# Patient Record
Sex: Male | Born: 1990 | Race: White | Hispanic: No | Marital: Married | Smoking: Current every day smoker
Health system: Southern US, Community
[De-identification: ages and names within clinical notes are randomized; demographics above are authoritative.]

## PROBLEM LIST (undated history)

## (undated) HISTORY — PX: MIDDLE EAR SURGERY: SHX713

---
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
## 2014-05-27 ENCOUNTER — Emergency Department (HOSPITAL_COMMUNITY)
Admission: EM | Admit: 2014-05-27 | Discharge: 2014-05-27 | Disposition: A | Discharge status: Home / Self Care | Payer: BC Managed Care – PPO | Attending: Emergency Medicine | Admitting: Emergency Medicine

## 2014-05-27 ENCOUNTER — Encounter (HOSPITAL_COMMUNITY): Payer: Self-pay | Admitting: Emergency Medicine

## 2014-05-27 DIAGNOSIS — I889 Nonspecific lymphadenitis, unspecified: Secondary | ICD-10-CM

## 2014-05-27 DIAGNOSIS — K088 Other specified disorders of teeth and supporting structures: Secondary | ICD-10-CM | POA: Diagnosis present

## 2014-05-27 DIAGNOSIS — Z720 Tobacco use: Secondary | ICD-10-CM | POA: Insufficient documentation

## 2014-05-27 MED ORDER — PENICILLIN V POTASSIUM 500 MG PO TABS: 500.0000 mg | ORAL_TABLET | Freq: Four times a day (QID) | ORAL | Status: AC

## 2014-05-27 NOTE — ED Notes (Signed)
Pt with left lower dental pain, has scheduled for tooth extraction soon, states swelling to chin, pain with swallowing at times

## 2014-05-27 NOTE — Discharge Instructions (Signed)
Follow up if not improving

## 2014-05-27 NOTE — ED Provider Notes (Signed)
CSN: [id number]     Arrival date & time 05/27/14  1456 History   First MD Initiated Contact with Patient 05/27/14 1549    This chart was scribed for Benny LennertJoseph L Shiniqua Groseclose, MD by Marica OtterNusrat Rahman, ED Scribe. This patient was seen in room APA10/APA10 and the patient's care was started at 3:59 PM.  Chief Complaint  Patient presents with  . Dental Problem   Patient is a 23 y.o. male presenting with tooth pain. The history is provided by the patient. No language interpreter was used.  Dental Pain Location:  Lower Quality:  Constant Severity:  Mild Timing:  Constant Progression:  Worsening Chronicity:  Recurrent Relieved by:  Acetaminophen and NSAIDs Associated symptoms: no congestion and no headaches    PCP: No primary care provider on file. HPI Comments: Jesus Jacobson is a 23 y.o. male, with medical Hx noted below, who presents to the Emergency Department complaining of left, lower dental pain and associated swelling. Pt reports that he has an appointment with an oral surgeon to have his left, lower dentures extracted on 12/4. Pt reports he has been taking ibuprofen and prescribed med at home with relief. Pt denies any medicinal allergies.   History reviewed. No pertinent past medical history. Past Surgical History  Procedure Laterality Date  . Middle ear surgery     No family history on file. History  Substance Use Topics  . Smoking status: Current Every Day Smoker -- 1.00 packs/day    Types: Cigarettes  . Smokeless tobacco: Not on file  . Alcohol Use: Yes     Comment: occasional    Review of Systems  Constitutional: Negative for appetite change and fatigue.  HENT: Positive for dental problem (dental pain). Negative for congestion, ear discharge and sinus pressure.   Eyes: Negative for discharge.  Respiratory: Negative for cough.   Cardiovascular: Negative for chest pain.  Gastrointestinal: Negative for abdominal pain and diarrhea.  Genitourinary: Negative for frequency and  hematuria.  Musculoskeletal: Negative for back pain.  Skin: Negative for rash.  Neurological: Negative for seizures and headaches.  Psychiatric/Behavioral: Negative for hallucinations.      Allergies  Review of patient's allergies indicates no known allergies.  Home Medications   Prior to Admission medications   Not on File   Triage Vitals: BP 134/84 mmHg  Pulse 90  Temp(Src) 99.3 F (37.4 C) (Oral)  Resp 16  Ht 6' (1.829 m)  Wt 150 lb (68.04 kg)  BMI 20.34 kg/m2  SpO2 100% Physical Exam  Constitutional: He is oriented to person, place, and time. He appears well-developed.  HENT:  Head: Normocephalic.  Mouth/Throat: Uvula is midline, oropharynx is clear and moist and mucous membranes are normal.  Minor tenderness to left anterior lymph node   Eyes: Conjunctivae are normal.  Neck: No tracheal deviation present.  Cardiovascular:  No murmur heard. Musculoskeletal: Normal range of motion.  Neurological: He is oriented to person, place, and time.  Skin: Skin is warm.  Psychiatric: He has a normal mood and affect.    ED Course  Procedures (including critical care time) DIAGNOSTIC STUDIES: Oxygen Saturation is 100% on RA, nl by my interpretation.    COORDINATION OF CARE: 4:01 PM-Discussed treatment plan which includes meds with pt at bedside and pt agreed to plan.   Labs Review Labs Reviewed - No data to display  Imaging Review No results found.   EKG Interpretation None      MDM   Final diagnoses:  None    Lymphadenitis,  tx with penn  The chart was scribed for me under my direct supervision.  I personally performed the history, physical, and medical decision making and all procedures in the evaluation of this patient.Benny Lennert.    Esraa Seres L Zariya Minner, MD 05/27/14 [phone]

## 2014-05-27 NOTE — ED Notes (Signed)
Pt c/o dental pain and swelling to left lower side. States he has appointment with dentist on 12/4 but pain and swelling are worsening.

## 2020-12-03 ENCOUNTER — Emergency Department (HOSPITAL_COMMUNITY): Payer: Self-pay

## 2020-12-03 ENCOUNTER — Other Ambulatory Visit: Payer: Self-pay

## 2020-12-03 ENCOUNTER — Emergency Department (HOSPITAL_COMMUNITY)
Admission: EM | Admit: 2020-12-03 | Discharge: 2020-12-03 | Disposition: A | Discharge status: Home / Self Care | Payer: Self-pay | Attending: Emergency Medicine | Admitting: Emergency Medicine

## 2020-12-03 ENCOUNTER — Ambulatory Visit
Admission: EM | Admit: 2020-12-03 | Discharge: 2020-12-03 | Disposition: A | Discharge status: Home / Self Care | Payer: Self-pay

## 2020-12-03 ENCOUNTER — Encounter (HOSPITAL_COMMUNITY): Payer: Self-pay | Admitting: *Deleted

## 2020-12-03 DIAGNOSIS — R079 Chest pain, unspecified: Secondary | ICD-10-CM | POA: Insufficient documentation

## 2020-12-03 DIAGNOSIS — F17210 Nicotine dependence, cigarettes, uncomplicated: Secondary | ICD-10-CM | POA: Insufficient documentation

## 2020-12-03 LAB — CBC
HCT: 44.1 % (ref 39.0–52.0)
Hemoglobin: 14.9 g/dL (ref 13.0–17.0)
MCH: 31.1 pg (ref 26.0–34.0)
MCHC: 33.8 g/dL (ref 30.0–36.0)
MCV: 92.1 fL (ref 80.0–100.0)
Platelets: 267 10*3/uL (ref 150–400)
RBC: 4.79 MIL/uL (ref 4.22–5.81)
RDW: 13 % (ref 11.5–15.5)
WBC: 6.3 10*3/uL (ref 4.0–10.5)
nRBC: 0 % (ref 0.0–0.2)

## 2020-12-03 LAB — TROPONIN I (HIGH SENSITIVITY): Troponin I (High Sensitivity): <2 ng/L (ref <18)

## 2020-12-03 LAB — BASIC METABOLIC PANEL
Anion gap: 7 (ref 5–15)
BUN: 9 mg/dL (ref 6–20)
CO2: 27 mmol/L (ref 22–32)
Calcium: 9.3 mg/dL (ref 8.9–10.3)
Chloride: 102 mmol/L (ref 98–111)
Creatinine, Ser: 1.12 mg/dL (ref 0.61–1.24)
GFR, Estimated: >60 mL/min (ref >60)
Glucose, Bld: 102 mg/dL — ABNORMAL HIGH (ref 70–99)
Potassium: 4.5 mmol/L (ref 3.5–5.1)
Sodium: 136 mmol/L (ref 135–145)

## 2020-12-03 MED ORDER — ESOMEPRAZOLE MAGNESIUM 40 MG PO CPDR: 40.0000 mg | DELAYED_RELEASE_CAPSULE | Freq: Every day | ORAL | 0 refills | Status: DC

## 2020-12-03 NOTE — ED Triage Notes (Signed)
Pt c/o intermittent mid chest dullness x 1 week. Denies SOB, nausea, dizziness. Originally thought it was related to food intake, but then realized it wasn't. Pt went to Urgent Care today and was sent to ED for further evaluation. Pt reports he drinks 6 pack of beer daily in the evenings, drinks 2-3 energy drinks daily and smokes 1 pack of cigarettes daily.

## 2020-12-03 NOTE — Discharge Instructions (Signed)
I suspect that your cp is likely gastrointestinal and I have started you on a medication for acid reduction. You should avoid alcohol, caffeine, spicy foods, fatty foods, and heavy meals. Your work up showed no emergent cause of pain. Your caregiver has diagnosed you as having chest pain that is not specific for one problem, but does not require admission.  You are at low risk for an acute heart condition or other serious illness. Chest pain comes from many different causes.  SEEK IMMEDIATE MEDICAL ATTENTION IF: You have severe chest pain, especially if the pain is crushing or pressure-like and spreads to the arms, back, neck, or jaw, or if you have sweating, nausea (feeling sick to your stomach), or shortness of breath. THIS IS AN EMERGENCY. Don't wait to see if the pain will go away. Get medical help at once. Call 911 or 0 (operator). DO NOT drive yourself to the hospital.  Your chest pain gets worse and does not go away with rest.  You have an attack of chest pain lasting longer than usual, despite rest and treatment with the medications your caregiver has prescribed.  You wake from sleep with chest pain or shortness of breath.  You feel dizzy or faint.  You have chest pain not typical of your usual pain for which you originally saw your caregiver.

## 2020-12-03 NOTE — ED Triage Notes (Signed)
For the past week pt has been having intermittent chest pain to center of chest. No pain currently.

## 2020-12-03 NOTE — ED Notes (Signed)
Patient is being discharged from the Urgent Care and sent to the Emergency Department via pov . Per Moshe Cipro, NP , patient is in need of higher level of care due to chest pain. Patient is aware and verbalizes understanding of plan of care.  Vitals:   12/03/20 0852  BP: 122/80  Pulse: 67  Resp: 18  Temp: 97.9 F (36.6 C)  SpO2: 97%

## 2020-12-03 NOTE — ED Provider Notes (Signed)
[facility] EMERGENCY DEPARTMENT Provider Note   CSN: [id number] Arrival date & time: 12/03/20  0865     History Chief Complaint  Patient presents with  . Chest Pain    Jesus Jacobson is a 30 y.o. male who presents emergency department with a chief complaint of chest pain.  Patient describes intermittent pain near his sternum lasting for a minute or 2 at a time.  He rates the pain at 7 out of 10, dull, aching, nonradiating.  He denies shortness of breath, nausea, vomiting, diaphoresis.  There are no exertional components.  At first the pain began with eating and now occurs without but does not wake him out of his sleep.  He has tried Tums and Tylenol which seem to have helped.  The patient does drink beer daily, 2-3 energy drinks daily, smokes a pack of cigarettes a day but denies a history of hypertension, hyperlipidemia, diabetes, family history of early MI, unilateral leg swelling, recent surgery or confinement, history of DVT or PE.  HPI  HPI: A 30 year old patient presents for evaluation of chest pain. Initial onset of pain was more than 6 hours ago. The patient's chest pain is not worse with exertion. The patient's chest pain is middle- or left-sided, is not well-localized, is not described as heaviness/pressure/tightness, is not sharp and does not radiate to the arms/jaw/neck. The patient does not complain of nausea and denies diaphoresis. The patient has smoked in the past 90 days. The patient has no history of stroke, has no history of peripheral artery disease, denies any history of treated diabetes, has no relevant family history of coronary artery disease (first degree relative at less than age 12), is not hypertensive, has no history of hypercholesterolemia and does not have an elevated BMI (>=30).   History reviewed. No pertinent past medical history.  There are no problems to display for this patient.   Past Surgical History:  Procedure Laterality Date  . MIDDLE EAR SURGERY          No family history on file.  Social History   Tobacco Use  . Smoking status: Current Every Day Smoker    Packs/day: 1.00    Types: Cigarettes  . Smokeless tobacco: Never Used  Vaping Use  . Vaping Use: Never used  Substance Use Topics  . Alcohol use: Yes    Alcohol/week: 42.0 standard drinks    Types: 42 Cans of beer per week    Comment: 6 pack of beer daily in the evenings  . Drug use: No    Home Medications Prior to Admission medications   Medication Sig Start Date End Date Taking? Authorizing Provider  esomeprazole (NEXIUM) 40 MG capsule Take 1 capsule (40 mg total) by mouth daily. 12/03/20  Yes Arthor Captain, PA-C    Allergies    Patient has no known allergies.  Review of Systems   Review of Systems Ten systems reviewed and are negative for acute change, except as noted in the HPI.   Physical Exam Updated Vital Signs BP 119/71 (BP Location: Left Arm)   Pulse 76   Temp 98.3 F (36.8 C) (Oral)   Resp 18   Ht 6' (1.829 m)   Wt 74.8 kg   SpO2 98%   BMI 22.38 kg/m   Physical Exam Physical Exam  Nursing note and vitals reviewed. Constitutional: He appears well-developed and well-nourished. No distress.  HENT:  Head: Normocephalic and atraumatic.  Eyes: Conjunctivae normal are normal. No scleral icterus.  Neck: Normal range  of motion. Neck supple.  Cardiovascular: Normal rate, regular rhythm and normal heart sounds.   Pulmonary/Chest: Effort normal and breath sounds normal. No respiratory distress.  Abdominal: Soft. There is no tenderness.  Musculoskeletal: He exhibits no edema.  Neurological: He is alert.  Skin: Skin is warm and dry. He is not diaphoretic.  Psychiatric: His behavior is normal.    ED Results / Procedures / Treatments   Labs (all labs ordered are listed, but only abnormal results are displayed) Labs Reviewed  BASIC METABOLIC PANEL - Abnormal; Notable for the following components:      Result Value   Glucose, Bld 102 (*)     All other components within normal limits  CBC  TROPONIN I (HIGH SENSITIVITY)  TROPONIN I (HIGH SENSITIVITY)    EKG EKG Interpretation  Date/Time:  Tuesday Dec 03 2020 09:33:29 EDT Ventricular Rate:  66 PR Interval:  186 QRS Duration: 107 QT Interval:  397 QTC Calculation: 416 R Axis:   70 Text Interpretation: Sinus rhythm RSR' in V1 or V2, probably normal variant ST elev, probable normal early repol pattern No previous ECGs available Confirmed by Vanetta Mulders [phone]) on 12/03/2020 10:01:00 AM   Radiology DG Chest Port 1 View  Result Date: 12/03/2020 CLINICAL DATA:  Chest pain. EXAM: PORTABLE CHEST 1 VIEW COMPARISON:  None. FINDINGS: The heart size and mediastinal contours are within normal limits. Both lungs are clear. The visualized skeletal structures are unremarkable. IMPRESSION: No active disease. Electronically Signed   By: Lupita Raider M.D.   On: 12/03/2020 09:55    Procedures Procedures   Medications Ordered in ED Medications - No data to display  ED Course  I have reviewed the triage vital signs and the nursing notes.  Pertinent labs & imaging results that were available during my care of the patient were reviewed by me and considered in my medical decision making (see chart for details).    MDM Rules/Calculators/A&P HEAR Score: 2                         Patient here with complaint of chest pain.  Low risk heart score PERC negative.  Suspect GI cause.  I ordered and reviewed BMP CBC both without significant abnormality, troponin within normal limits.  I ordered and reviewed images of a portable 1 view chest x-ray which shows no abnormalities or wide mediastinum.  Patient's EKG shows some early repolarization.  But otherwise normal sinus rhythm at a rate of 66.  Given the large differential diagnosis for Jesus Jacobson, the decision making in this case is of high complexity.  After evaluating all of the data points in this case, the presentation of Jesus Jacobson is NOT consistent with Acute Coronary Syndrome (ACS) and/or myocardial ischemia, pulmonary embolism, aortic dissection; Borhaave's, significant arrythmia, pneumothorax, cardiac tamponade, or other emergent cardiopulmonary condition.  Further, the presentation of Jesus Jacobson is NOT consistent with pericarditis, myocarditis, cholecystitis, pancreatitis, mediastinitis, endocarditis, new valvular disease.  Additionally, the presentation of Jesus Jacobson NOT consistent with flail chest, cardiac contusion, ARDS, or significant intra-thoracic or intra-abdominal bleeding.  Moreover, this presentation is NOT consistent with pneumonia, sepsis, or pyelonephritis.      Strict return and follow-up precautions have been given by me personally or by detailed written instruction given verbally by nursing staff using the teach back method to the patient/family/caregiver(s).  Data Reviewed/Counseling: I have reviewed the patient's vital signs, nursing notes, and other relevant tests/information. I had a  detailed discussion regarding the historical points, exam findings, and any diagnostic results supporting the discharge diagnosis. I also discussed the need for outpatient follow-up and the need to return to the ED if symptoms worsen or if there are any questions or concerns that arise at home.  Final Clinical Impression(s) / ED Diagnoses Final diagnoses:  Chest pain with low risk for cardiac etiology    Rx / DC Orders ED Discharge Orders         Ordered    esomeprazole (NEXIUM) 40 MG capsule  Daily        12/03/20 1215           Arthor Captain, PA-C 12/03/20 1237    Vanetta Mulders, MD 12/05/20 [phone]

## 2020-12-11 ENCOUNTER — Ambulatory Visit: Payer: Self-pay | Admitting: Internal Medicine

## 2020-12-22 ENCOUNTER — Ambulatory Visit
Admission: EM | Admit: 2020-12-22 | Discharge: 2020-12-22 | Disposition: A | Discharge status: Home / Self Care | Payer: Self-pay | Attending: Family Medicine | Admitting: Family Medicine

## 2020-12-22 ENCOUNTER — Other Ambulatory Visit: Payer: Self-pay

## 2020-12-22 ENCOUNTER — Encounter: Payer: Self-pay | Admitting: Emergency Medicine

## 2020-12-22 DIAGNOSIS — R21 Rash and other nonspecific skin eruption: Secondary | ICD-10-CM

## 2020-12-22 MED ORDER — TRIAMCINOLONE ACETONIDE 0.025 % EX OINT: 1.0000 "application " | TOPICAL_OINTMENT | Freq: Two times a day (BID) | CUTANEOUS | 0 refills | Status: AC

## 2020-12-22 MED ORDER — PREDNISONE 20 MG PO TABS: 40.0000 mg | ORAL_TABLET | Freq: Every day | ORAL | 0 refills | Status: DC

## 2020-12-22 MED ORDER — FAMOTIDINE 40 MG PO TABS: 40.0000 mg | ORAL_TABLET | Freq: Two times a day (BID) | ORAL | 0 refills | Status: DC

## 2020-12-22 NOTE — ED Triage Notes (Signed)
Rash all over body that comes and goes x 2 weeks that keeps getting worse. Rash looks worse today. Unsure of cause.

## 2020-12-22 NOTE — ED Provider Notes (Signed)
[facility]    CSN: [id number] Arrival date & time: 12/22/20  1227      History   Chief Complaint Chief Complaint  Patient presents with   Rash    HPI Jesus Jacobson is a 30 y.o. male.   HPI Generalized macula like rash eruption generalized to entire body.  He denies any recent viral infection.  He reports that the rash is occasionally pruritic.  The rash comes and goes throughout the day.  He reports the only changes prior to the onset of the rash is he started on Nexium for acid reflux.  He initially thought the rash was related to Nexium and discontinued to Nexium however resumed back taking it after the rash reappeared and his reflux symptoms worsen.  He denies any history of any type of chronic recurrent skin rashes.  History reviewed. No pertinent past medical history.  There are no problems to display for this patient.   Past Surgical History:  Procedure Laterality Date   MIDDLE EAR SURGERY         Home Medications    Prior to Admission medications   Medication Sig Start Date End Date Taking? Authorizing Provider  esomeprazole (NEXIUM) 40 MG capsule Take 1 capsule (40 mg total) by mouth daily. 12/03/20   Arthor Captain, PA-C    Family History No family history on file.  Social History Social History   Tobacco Use   Smoking status: Every Day    Packs/day: 1.00    Pack years: 0.00    Types: Cigarettes   Smokeless tobacco: Never  Vaping Use   Vaping Use: Never used  Substance Use Topics   Alcohol use: Yes    Alcohol/week: 42.0 standard drinks    Types: 42 Cans of beer per week    Comment: 6 pack of beer daily in the evenings   Drug use: No     Allergies   Patient has no known allergies.   Review of Systems Review of Systems Pertinent negatives listed in HPI   Physical Exam Triage Vital Signs ED Triage Vitals [12/22/20 1408]  Enc Vitals Group     BP (!) 131/92     Pulse Rate 88     Resp 19     Temp 99.5 F (37.5 C)      Temp Source Oral     SpO2 98 %     Weight      Height      Head Circumference      Peak Flow      Pain Score 0     Pain Loc      Pain Edu?      Excl. in GC?    No data found.  Updated Vital Signs BP (!) 131/92 (BP Location: Right Arm)   Pulse 88   Temp 99.5 F (37.5 C) (Oral)   Resp 19   SpO2 98%   Visual Acuity Right Eye Distance:   Left Eye Distance:   Bilateral Distance:    Right Eye Near:   Left Eye Near:    Bilateral Near:     Physical Exam Constitutional:      Appearance: Normal appearance.  Cardiovascular:     Rate and Rhythm: Normal rate and regular rhythm.  Pulmonary:     Effort: Pulmonary effort is normal.     Breath sounds: Normal breath sounds.  Skin:    General: Skin is warm.     Comments: Generalized body rash macular with oval-shaped  patterns upper and lower extremities entire torso neck.  Face is unaffected  Neurological:     Mental Status: He is alert.  Psychiatric:        Attention and Perception: Attention normal.        Speech: Speech normal.        Cognition and Memory: Cognition normal.     UC Treatments / Results  Labs (all labs ordered are listed, but only abnormal results are displayed) Labs Reviewed - No data to display  EKG   Radiology No results found.  Procedures Procedures (including critical care time)  Medications Ordered in UC Medications - No data to display  Initial Impression / Assessment and Plan / UC Course  I have reviewed the triage vital signs and the nursing notes.  Pertinent labs & imaging results that were available during my care of the patient were reviewed by me and considered in my medical decision making (see chart for details).    Rash of unknown etiology affecting the entire body.  Rash looks suspicious to previous reactions related to medications versus pityriasis rosea. Will trial prednisone, dc Nexium start famotidine and triamcinolone application as needed.  Advised if symptoms do not  readily resolve with prescribed medication would recommend evaluation by dermatology. Final Clinical Impressions(s) / UC Diagnoses   Final diagnoses:  Rash of entire body     Discharge Instructions      Discontinue Nexium as I suspect this rash may have been erupted due to an allergic reaction..  Starting on famotidine 40 mg twice daily as needed for acid reflux symptoms.  Start prednisone 40 mg once daily for the next 5 days take with food to prevent insomnia take in the morning time. For topical irritation and rash apply triamcinolone cream twice daily to the entire affected area.  You may also mix with petroleum jelly to help provide additional moisture to your skin. If rash persist and does not resolve with prescribed treatment follow-up with dermatology for further work-up and evaluation.     ED Prescriptions     Medication Sig Dispense Auth. Provider   famotidine (PEPCID) 40 MG tablet Take 1 tablet (40 mg total) by mouth 2 (two) times daily. 180 tablet Bing Neighbors, FNP   predniSONE (DELTASONE) 20 MG tablet Take 2 tablets (40 mg total) by mouth daily with breakfast. 10 tablet Bing Neighbors, FNP   triamcinolone (KENALOG) 0.025 % ointment Apply 1 application topically 2 (two) times daily. 454 g Bing Neighbors, FNP      PDMP not reviewed this encounter.   Bing Neighbors, FNP 12/23/20 [phone]

## 2020-12-22 NOTE — Discharge Instructions (Signed)
Discontinue Nexium as I suspect this rash may have been erupted due to an allergic reaction..  Starting on famotidine 40 mg twice daily as needed for acid reflux symptoms.  Start prednisone 40 mg once daily for the next 5 days take with food to prevent insomnia take in the morning time. For topical irritation and rash apply triamcinolone cream twice daily to the entire affected area.  You may also mix with petroleum jelly to help provide additional moisture to your skin. If rash persist and does not resolve with prescribed treatment follow-up with dermatology for further work-up and evaluation.

## 2020-12-27 ENCOUNTER — Other Ambulatory Visit: Payer: Self-pay

## 2020-12-27 ENCOUNTER — Ambulatory Visit (INDEPENDENT_AMBULATORY_CARE_PROVIDER_SITE_OTHER): Payer: Self-pay | Admitting: Internal Medicine

## 2020-12-27 ENCOUNTER — Encounter: Payer: Self-pay | Admitting: Internal Medicine

## 2020-12-27 VITALS — BP 134/75 | HR 82 | Temp 98.3°F | Resp 20 | Ht 73.0 in | Wt 174.0 lb

## 2020-12-27 DIAGNOSIS — T7840XA Allergy, unspecified, initial encounter: Secondary | ICD-10-CM | POA: Insufficient documentation

## 2020-12-27 DIAGNOSIS — K219 Gastro-esophageal reflux disease without esophagitis: Secondary | ICD-10-CM

## 2020-12-27 DIAGNOSIS — Z7689 Persons encountering health services in other specified circumstances: Secondary | ICD-10-CM | POA: Insufficient documentation

## 2020-12-27 DIAGNOSIS — Z720 Tobacco use: Secondary | ICD-10-CM

## 2020-12-27 DIAGNOSIS — Z1159 Encounter for screening for other viral diseases: Secondary | ICD-10-CM

## 2020-12-27 DIAGNOSIS — T7840XD Allergy, unspecified, subsequent encounter: Secondary | ICD-10-CM

## 2020-12-27 NOTE — Assessment & Plan Note (Signed)
Smokes about 0.5 pack/day  Asked about quitting: confirms that he currently smokes cigarettes Advise to quit smoking: Educated about QUITTING to reduce the risk of cancer, cardio and cerebrovascular disease. Assess willingness: Unwilling to quit at this time, but is working on cutting back. Assist with counseling and pharmacotherapy: Counseled for 5 minutes and literature provided. Arrange for follow up: follow up and continue to offer help.

## 2020-12-27 NOTE — Patient Instructions (Signed)
Please start taking Pepcid for acid reflux.

## 2020-12-27 NOTE — Progress Notes (Signed)
New Patient Office Visit  Subjective:  Patient ID: Jesus Jacobson, male    DOB: 12-15-1990  Age: 30 y.o. MRN: [id number]  CC:  Chief Complaint  Patient presents with   New Patient (Initial Visit)    HPI Jesus Jacobson is a 30 year old male with PMH of GERD who presents for establishing care.  He recently had ER visit for c/o diffuse maculopapular rash over b/l UE, chest and abdominal wall and LE. He started noticing rash in neck area and went to ER at that point. He was given steroids and was told to hold Nexium which he had started taking recently. States that his rash has been improving for last 5 days since he had his last Nexium. He has been taking Benadryl PRN.  He smokes about 0.5 pack/day.  Patient has not had COVID vaccine yet.  History reviewed. No pertinent past medical history.  Past Surgical History:  Procedure Laterality Date   MIDDLE EAR SURGERY      History reviewed. No pertinent family history.  Social History   Socioeconomic History   Marital status: Single    Spouse name: Not on file   Number of children: Not on file   Years of education: Not on file   Highest education level: Not on file  Occupational History   Not on file  Tobacco Use   Smoking status: Every Day    Packs/day: 1.00    Pack years: 0.00    Types: Cigarettes   Smokeless tobacco: Never  Vaping Use   Vaping Use: Never used  Substance and Sexual Activity   Alcohol use: Yes    Alcohol/week: 42.0 standard drinks    Types: 42 Cans of beer per week    Comment: 1-2 daily   Drug use: No   Sexual activity: Yes  Other Topics Concern   Not on file  Social History Narrative   Not on file   Social Determinants of Health   Financial Resource Strain: Not on file  Food Insecurity: Not on file  Transportation Needs: Not on file  Physical Activity: Not on file  Stress: Not on file  Social Connections: Not on file  Intimate Partner Violence: Not on file    ROS Review of Systems   Constitutional:  Negative for chills and fever.  HENT:  Negative for congestion and sore throat.   Eyes:  Negative for pain and discharge.  Respiratory:  Negative for cough and shortness of breath.   Cardiovascular:  Negative for chest pain and palpitations.  Gastrointestinal:  Negative for constipation, diarrhea, nausea and vomiting.  Endocrine: Negative for polydipsia and polyuria.  Genitourinary:  Negative for dysuria and hematuria.  Musculoskeletal:  Negative for neck pain and neck stiffness.  Skin:  Positive for rash.  Neurological:  Negative for dizziness, weakness, numbness and headaches.  Psychiatric/Behavioral:  Negative for agitation and behavioral problems.    Objective:   Today's Vitals: BP 134/75 (BP Location: Right Arm, Patient Position: Sitting, Cuff Size: Large)   Pulse 82   Temp 98.3 F (36.8 C)   Resp 20   Ht 6' 1"  (1.854 m)   Wt 174 lb (78.9 kg)   SpO2 97%   BMI 22.96 kg/m   Physical Exam Vitals reviewed.  Constitutional:      General: He is not in acute distress.    Appearance: He is not diaphoretic.  HENT:     Head: Normocephalic and atraumatic.     Nose: Nose normal.  Mouth/Throat:     Mouth: Mucous membranes are moist.  Eyes:     General: No scleral icterus.    Extraocular Movements: Extraocular movements intact.  Cardiovascular:     Rate and Rhythm: Normal rate and regular rhythm.     Pulses: Normal pulses.     Heart sounds: Normal heart sounds. No murmur heard. Pulmonary:     Breath sounds: Normal breath sounds. No wheezing or rales.  Abdominal:     Palpations: Abdomen is soft.     Tenderness: There is no abdominal tenderness.  Musculoskeletal:     Cervical back: Neck supple. No tenderness.     Right lower leg: No edema.     Left lower leg: No edema.  Skin:    General: Skin is warm.     Findings: Rash (maculopapular rash over b/l UE and chest wall) present.  Neurological:     General: No focal deficit present.     Mental Status:  He is alert and oriented to person, place, and time.     Sensory: No sensory deficit.     Motor: No weakness.  Psychiatric:        Mood and Affect: Mood normal.        Behavior: Behavior normal.    Assessment & Plan:   Problem List Items Addressed This Visit       Digestive   Gastroesophageal reflux disease without esophagitis    Had allergic reaction to Nexium (likely) Started on Pepcid PRN Advised to avoid hot and spicy food         Other   Encounter to establish care - Primary    Care established Previous chart reviewed History and medications reviewed with the patient       Relevant Orders   CBC with Differential/Platelet   CMP14+EGFR   Lipid panel   TSH   Tobacco abuse    Smokes about 0.5 pack/day  Asked about quitting: confirms that he currently smokes cigarettes Advise to quit smoking: Educated about QUITTING to reduce the risk of cancer, cardio and cerebrovascular disease. Assess willingness: Unwilling to quit at this time, but is working on cutting back. Assist with counseling and pharmacotherapy: Counseled for 5 minutes and literature provided. Arrange for follow up: follow up and continue to offer help.       Allergic reaction    Likely to Nexium Rash similar to Pityriasis Better since stopping Nexium; and with Prednisone and Benadryl Also does yard work, which could be a contributing factor, advised to contact if persistent rash Triamcinolone cream for itching Benadryl or Zyrtec PRN       Other Visit Diagnoses     Need for hepatitis C screening test       Relevant Orders   Hepatitis C Antibody       Outpatient Encounter Medications as of 12/27/2020  Medication Sig   famotidine (PEPCID) 40 MG tablet Take 1 tablet (40 mg total) by mouth 2 (two) times daily.   triamcinolone (KENALOG) 0.025 % ointment Apply 1 application topically 2 (two) times daily.   [DISCONTINUED] predniSONE (DELTASONE) 20 MG tablet Take 2 tablets (40 mg total) by mouth  daily with breakfast.   No facility-administered encounter medications on file as of 12/27/2020.    Follow-up: Return in about 1 year (around 12/27/2021) for Annual physical.   Lindell Spar, MD

## 2020-12-27 NOTE — Assessment & Plan Note (Signed)
Care established Previous chart reviewed History and medications reviewed with the patient 

## 2020-12-27 NOTE — Assessment & Plan Note (Addendum)
Had allergic reaction to Nexium (likely) Started on Pepcid PRN Advised to avoid hot and spicy food

## 2020-12-27 NOTE — Assessment & Plan Note (Addendum)
Likely to Nexium Rash similar to Pityriasis Better since stopping Nexium; and with Prednisone and Benadryl Also does yard work, which could be a contributing factor, advised to contact if persistent rash Triamcinolone cream for itching Benadryl or Zyrtec PRN

## 2021-02-24 ENCOUNTER — Emergency Department (HOSPITAL_COMMUNITY)
Admission: EM | Admit: 2021-02-24 | Discharge: 2021-02-24 | Disposition: A | Discharge status: Home / Self Care | Payer: Self-pay | Attending: Student | Admitting: Student

## 2021-02-24 ENCOUNTER — Other Ambulatory Visit: Payer: Self-pay

## 2021-02-24 ENCOUNTER — Encounter (HOSPITAL_COMMUNITY): Payer: Self-pay | Admitting: *Deleted

## 2021-02-24 DIAGNOSIS — F17210 Nicotine dependence, cigarettes, uncomplicated: Secondary | ICD-10-CM | POA: Insufficient documentation

## 2021-02-24 DIAGNOSIS — R21 Rash and other nonspecific skin eruption: Secondary | ICD-10-CM | POA: Insufficient documentation

## 2021-02-24 LAB — CBC WITH DIFFERENTIAL/PLATELET
Abs Immature Granulocytes: 0.06 10*3/uL (ref 0.00–0.07)
Basophils Absolute: 0.1 10*3/uL (ref 0.0–0.1)
Basophils Relative: 0 %
Eosinophils Absolute: 0.1 10*3/uL (ref 0.0–0.5)
Eosinophils Relative: 1 %
HCT: 45.4 % (ref 39.0–52.0)
Hemoglobin: 15.5 g/dL (ref 13.0–17.0)
Immature Granulocytes: 0 %
Lymphocytes Relative: 8 %
Lymphs Abs: 1.2 10*3/uL (ref 0.7–4.0)
MCH: 31.1 pg (ref 26.0–34.0)
MCHC: 34.1 g/dL (ref 30.0–36.0)
MCV: 91.2 fL (ref 80.0–100.0)
Monocytes Absolute: 1.2 10*3/uL — ABNORMAL HIGH (ref 0.1–1.0)
Monocytes Relative: 8 %
Neutro Abs: 11.9 10*3/uL — ABNORMAL HIGH (ref 1.7–7.7)
Neutrophils Relative %: 83 %
Platelets: 297 10*3/uL (ref 150–400)
RBC: 4.98 MIL/uL (ref 4.22–5.81)
RDW: 13.1 % (ref 11.5–15.5)
WBC: 14.5 10*3/uL — ABNORMAL HIGH (ref 4.0–10.5)
nRBC: 0 % (ref 0.0–0.2)

## 2021-02-24 LAB — URINALYSIS, ROUTINE W REFLEX MICROSCOPIC
Bacteria, UA: NONE SEEN
Bilirubin Urine: NEGATIVE
Glucose, UA: NEGATIVE mg/dL
Ketones, ur: 5 mg/dL — AB
Leukocytes,Ua: NEGATIVE
Nitrite: NEGATIVE
Protein, ur: NEGATIVE mg/dL
Specific Gravity, Urine: 1.004 — ABNORMAL LOW (ref 1.005–1.030)
pH: 6 (ref 5.0–8.0)

## 2021-02-24 LAB — COMPREHENSIVE METABOLIC PANEL
ALT: 26 U/L (ref 0–44)
AST: 25 U/L (ref 15–41)
Albumin: 4.7 g/dL (ref 3.5–5.0)
Alkaline Phosphatase: 75 U/L (ref 38–126)
Anion gap: 10 (ref 5–15)
BUN: 13 mg/dL (ref 6–20)
CO2: 22 mmol/L (ref 22–32)
Calcium: 9.4 mg/dL (ref 8.9–10.3)
Chloride: 105 mmol/L (ref 98–111)
Creatinine, Ser: 1.02 mg/dL (ref 0.61–1.24)
GFR, Estimated: >60 mL/min (ref >60)
Glucose, Bld: 98 mg/dL (ref 70–99)
Potassium: 4 mmol/L (ref 3.5–5.1)
Sodium: 137 mmol/L (ref 135–145)
Total Bilirubin: 0.6 mg/dL (ref 0.3–1.2)
Total Protein: 7.4 g/dL (ref 6.5–8.1)

## 2021-02-24 LAB — HIV ANTIBODY (ROUTINE TESTING W REFLEX): HIV Screen 4th Generation wRfx: NONREACTIVE

## 2021-02-24 LAB — C-REACTIVE PROTEIN: CRP: 0.8 mg/dL (ref <1.0)

## 2021-02-24 LAB — HEPATITIS PANEL, ACUTE
HCV Ab: NONREACTIVE
Hep A IgM: NONREACTIVE
Hep B C IgM: NONREACTIVE
Hepatitis B Surface Ag: NONREACTIVE

## 2021-02-24 LAB — SEDIMENTATION RATE: Sed Rate: 2 mm/hr (ref 0–16)

## 2021-02-24 MED ORDER — FAMOTIDINE IN NACL 20-0.9 MG/50ML-% IV SOLN
20.0000 mg | Freq: Once | INTRAVENOUS | Status: AC
  Administered 2021-02-24: 20 mg via INTRAVENOUS
  Filled 2021-02-24: qty 50

## 2021-02-24 MED ORDER — PREDNISONE 10 MG PO TABS: 30.0000 mg | ORAL_TABLET | Freq: Every day | ORAL | 0 refills | Status: AC

## 2021-02-24 MED ORDER — METHYLPREDNISOLONE SODIUM SUCC 125 MG IJ SOLR
125.0000 mg | Freq: Once | INTRAMUSCULAR | Status: AC
  Administered 2021-02-24: 125 mg via INTRAVENOUS
  Filled 2021-02-24: qty 2

## 2021-02-24 NOTE — ED Triage Notes (Signed)
Rash over entire body with itching, states it started after breakfast this am. States he has had episodes of hives for over a month

## 2021-02-24 NOTE — Discharge Instructions (Addendum)
Suspect the rash is a allergic reaction to something within your environment.  I have started you on steroids please take as prescribed.  Please continue taking Zyrtec as well as Pepcid as this will help with the rash.  Please follow-up with the allergist as well as dermatology for further evaluation.  Come back to the emergency department if you develop chest pain, shortness of breath, severe abdominal pain, uncontrolled nausea, vomiting, diarrhea.

## 2021-02-24 NOTE — ED Provider Notes (Signed)
[facility] EMERGENCY DEPARTMENT Provider Note   CSN: [id number] Arrival date & time: 02/24/21  1210     History Chief Complaint  Patient presents with   Allergic Reaction    Jesus Jacobson is a 30 y.o. male.  HPI  Patient with no significant medical history presents to the emergency department with chief complaint of a rash.  Patient states the rash started this morning, states that he ate his breakfast which consists of bacon, eggs, grits, this is stuff that he is ate in the past, he then broke out in a full body rash, states that initially was on his forearms and his chest but now spread to his lower extremities back and face.  He states the rash is not itchy, does not sting, no associated shortness of breath, tongue, throat, lip swelling, no GI symptoms.  He notes that he took Excedrin earlier today because he had a headache, states he has had this in the past, he took this morning because he was hung over from last night.  He notes that he has had a rash like this once in the past, was when he was started on Nexium, he immediately broke out in a rash similar to this, he has discontinued Nexium as not taking this since.  He change in detergents, new animals, buying new clothes, denies recent tick bites, denies penile discharge or testicular pain, states he is monogamous relationship.  He states that he took some Benadryl today which had seemed to help slightly.  He has no other complaints at this time.  He does not endorse fevers, chills, chest pain, abdominal pain, nausea, vomiting, diarrhea.  History reviewed. No pertinent past medical history.  Patient Active Problem List   Diagnosis Date Noted   Encounter to establish care 12/27/2020   Gastroesophageal reflux disease without esophagitis 12/27/2020   Tobacco abuse 12/27/2020   Allergic reaction 12/27/2020    Past Surgical History:  Procedure Laterality Date   MIDDLE EAR SURGERY         No family history on file.  Social  History   Tobacco Use   Smoking status: Every Day    Packs/day: 1.00    Types: Cigarettes   Smokeless tobacco: Never  Vaping Use   Vaping Use: Never used  Substance Use Topics   Alcohol use: Yes    Alcohol/week: 42.0 standard drinks    Types: 42 Cans of beer per week    Comment: 1-2 daily   Drug use: No    Home Medications Prior to Admission medications   Medication Sig Start Date End Date Taking? Authorizing Provider  predniSONE (DELTASONE) 10 MG tablet Take 3 tablets (30 mg total) by mouth daily for 5 days. 02/24/21 03/01/21 Yes Carroll Sage, PA-C  famotidine (PEPCID) 40 MG tablet Take 1 tablet (40 mg total) by mouth 2 (two) times daily. 12/22/20   Bing Neighbors, FNP  triamcinolone (KENALOG) 0.025 % ointment Apply 1 application topically 2 (two) times daily. 12/22/20   Bing Neighbors, FNP    Allergies    Nexium [esomeprazole]  Review of Systems   Review of Systems  Constitutional:  Negative for chills and fever.  HENT:  Negative for congestion.   Respiratory:  Negative for shortness of breath.   Cardiovascular:  Negative for chest pain.  Gastrointestinal:  Negative for abdominal pain.  Genitourinary:  Negative for enuresis.  Musculoskeletal:  Negative for back pain.  Skin:  Positive for rash.  Neurological:  Negative for dizziness.  Hematological:  Does not bruise/bleed easily.   Physical Exam Updated Vital Signs BP 134/67   Pulse 60   Temp 97.6 F (36.4 C) (Oral)   Resp 19   SpO2 100%   Physical Exam Vitals and nursing note reviewed.  Constitutional:      General: He is not in acute distress.    Appearance: He is not ill-appearing.  HENT:     Head: Normocephalic and atraumatic.     Nose: No congestion.     Mouth/Throat:     Mouth: Mucous membranes are moist.     Pharynx: Oropharynx is clear. No oropharyngeal exudate or posterior oropharyngeal erythema.     Comments: Oropharynx is visualized tongue uvula are both midline, controlling oral  secretions, no oral lesions present. Eyes:     Conjunctiva/sclera: Conjunctivae normal.  Cardiovascular:     Rate and Rhythm: Normal rate and regular rhythm.     Pulses: Normal pulses.     Heart sounds: No murmur heard.   No friction rub. No gallop.  Pulmonary:     Effort: No respiratory distress.     Breath sounds: No wheezing, rhonchi or rales.  Abdominal:     Palpations: Abdomen is soft.     Tenderness: There is no abdominal tenderness. There is no right CVA tenderness or left CVA tenderness.  Skin:    General: Skin is warm and dry.     Comments: Patient has a systemic rash covers the palms and soles, morbilliform like rash, blanches with pressure, no scaling or peeling of the skin, the rash on the dorsum of the feet have a center clearing but the rash is located on the upper body is red throughout.  Please see picture for full detail.  Neurological:     Mental Status: He is alert.  Psychiatric:        Mood and Affect: Mood normal.           ED Results / Procedures / Treatments   Labs (all labs ordered are listed, but only abnormal results are displayed) Labs Reviewed  CBC WITH DIFFERENTIAL/PLATELET - Abnormal; Notable for the following components:      Result Value   WBC 14.5 (*)    Neutro Abs 11.9 (*)    Monocytes Absolute 1.2 (*)    All other components within normal limits  URINALYSIS, ROUTINE W REFLEX MICROSCOPIC - Abnormal; Notable for the following components:   Color, Urine STRAW (*)    Specific Gravity, Urine 1.004 (*)    Hgb urine dipstick SMALL (*)    Ketones, ur 5 (*)    All other components within normal limits  COMPREHENSIVE METABOLIC PANEL  C-REACTIVE PROTEIN  SEDIMENTATION RATE  HEPATITIS PANEL, ACUTE  HIV ANTIBODY (ROUTINE TESTING W REFLEX)  RPR  GC/CHLAMYDIA PROBE AMP ([place]) NOT AT [facility]    EKG None  Radiology No results found.  Procedures Procedures   Medications Ordered in ED Medications  methylPREDNISolone sodium  succinate (SOLU-MEDROL) 125 mg/2 mL injection 125 mg (125 mg Intravenous Given 02/24/21 1538)  famotidine (PEPCID) IVPB 20 mg premix (0 mg Intravenous Stopped 02/24/21 1553)    ED Course  I have reviewed the triage vital signs and the nursing notes.  Pertinent labs & imaging results that were available during my care of the patient were reviewed by me and considered in my medical decision making (see chart for details).    MDM Rules/Calculators/A&P  Initial impression-patient presents to the emergency department chief complaint of a systemic rash.  He will alert, does not appear in distress, vital signs reassuring.  Concern for dress syndrome will obtain further lab work and reevaluate.  Work-up-CBC shows slight leukocytosis of 14.5, CMP unremarkable, UA unremarkable, sed rate is 2, C-reactive and 0.8, hepatitis, syphilis, HIV all pending at this time.  Reassessment-patient was reassessed after providing him with steroids, H1 H2 blockers, rash has completely resolved.  Vital signs remained stable.  Patient agreed for discharge at this time.  Rule out-low suspicion for TEN/Steven Johnson's  there is no oral lesions, no noted skin sloughing.  Low suspicion for anaphylactic shock as vital signs reassuring, denies GI symptoms, no oral edema present.  Low suspicion for systemic infection i.e. sepsis or cellulitis as vital signs reassuring, patient is nontoxic-appearing, he does have a slight white count of 14.5 but I suspect this is more of acute phase reactant.  I have low suspicion for HIV, syphilis, gonorrhea, chlamydia as patient is in a monogamous relationship, denies testicular pain, penile discharge, rashes atypical of etiology.  Low suspicion for tickborne illness as he has no associated arthritis, neuro complaints, no bull's-eye rash present.  Low suspicion for press syndrome as he has no severely increased eosinophils seen on CBC, rashes atypical of  etiology.  Plan-  Rash since resolved-likely this is a medication induced rash, will start him on steroids, recommend he continue with H1 and H2 blockers, will refer him to dermatology as well as an allergist for further evaluation.  Vital signs have remained stable, no indication for hospital admission.  Patient discussed with attending and they agreed with assessment and plan.  Patient given at home care as well strict return precautions.  Patient verbalized that they understood agreed to said plan.  Final Clinical Impression(s) / ED Diagnoses Final diagnoses:  Rash    Rx / DC Orders ED Discharge Orders          Ordered    predniSONE (DELTASONE) 10 MG tablet  Daily        02/24/21 1734             Barnie Del 02/24/21 1737    Glendora Score, MD 02/25/21 [phone]

## 2021-02-25 LAB — GC/CHLAMYDIA PROBE AMP (~~LOC~~) NOT AT ARMC
Chlamydia: NEGATIVE
Comment: NEGATIVE
Comment: NORMAL
Neisseria Gonorrhea: NEGATIVE

## 2021-02-25 LAB — RPR: RPR Ser Ql: NONREACTIVE

## 2021-03-12 ENCOUNTER — Other Ambulatory Visit: Payer: Self-pay

## 2021-03-12 ENCOUNTER — Ambulatory Visit: Payer: Self-pay | Admitting: Allergy & Immunology

## 2021-03-12 VITALS — BP 136/80 | HR 79 | Temp 98.7°F | Resp 16 | Ht 73.0 in | Wt 175.8 lb

## 2021-03-12 DIAGNOSIS — L508 Other urticaria: Secondary | ICD-10-CM

## 2021-03-12 NOTE — Progress Notes (Signed)
NEW PATIENT  Date of Service/Encounter:  03/12/21  Consult requested by: Anabel Halon, MD   Assessment:   Chronic urticaria in the setting of no atopic history (therefore deferred testing)  Uninsured status  Plan/Recommendations:   1. Chronic urticaria - We did not do testing since you did not have clear environmental or food allergy symptoms.  - I think that would be a waste of time and money.  - We are going to get an alpha gal panel to see if you have a red meat sensitivity. - We are going to get environmental allergy testing via the blood.  - I would start a daily antihistamine EVERY day to keep the hives at bay (you can do up to twice daily). - Continue with the famotidine.   2. Return in about 3 months (around 06/11/2021).    This note in its entirety was forwarded to the Provider who requested this consultation.  Subjective:   Jesus Jacobson is a 30 y.o. male presenting today for evaluation of  Chief Complaint  Patient presents with   Allergy Testing    June 19 started taking NSAIDS and began breaking out and had to be taken to the ED. States that he breaks out in hives at random times and not sure the cause.     Jesus Jacobson has a history of the following: Patient Active Problem List   Diagnosis Date Noted   Encounter to establish care 12/27/2020   Gastroesophageal reflux disease without esophagitis 12/27/2020   Tobacco abuse 12/27/2020   Allergic reaction 12/27/2020    History obtained from: chart review and patient.  Jesus Jacobson was referred by Anabel Halon, MD.     Miklos is a 30 y.o. male presenting for an evaluation of urticaria . He has been having some breakouts since mid June or so. He is not sure whether this is related to an allergy to anything. He is not sure whether it is related to foods or not. He has not changed anything in his diet. He has not had issues with his bathing. Around 7 or 8 in the morning, he noticed  that he was having breakouts on his arms.  He works as a Chartered certified accountant at [employer]. He has been around the same chemicals for 6 years.  He was seen in the ED in August 2022.  At that time, he had a rash.  A work-up was largely negative.  GC and Chlamydia were negative, metabolic panel was negative, inflammatory markers were normal.  RPR was nonreactive.  Liver function tests were negative.  Complete blood count was notable for a leukocytosis, otherwise negative.  HIV was negative.  Urinalysis was negative.  He was given methylprednisolone as well as famotidine.   Around that time, he had taken Excedrin due to have a headache. He had another episode with ibuprofen at some point as well. There were no NSAIDs with June episode.   There are no new medications aside from Pepcid which was started mid July 2022.  The rash was going on before that.  He has a 32yo and a 32yo kiddo.  Otherwise, there is no history of other atopic diseases, including asthma, food allergies, drug allergies, environmental allergies, stinging insect allergies, eczema, or contact dermatitis. There is no significant infectious history. Vaccinations are up to date.    Past Medical History: Patient Active Problem List   Diagnosis Date Noted   Encounter to establish care 12/27/2020  Gastroesophageal reflux disease without esophagitis 12/27/2020   Tobacco abuse 12/27/2020   Allergic reaction 12/27/2020    Medication List:  Allergies as of 03/12/2021       Reactions   Esomeprazole Magnesium Hives, Itching, Rash   Nexium [esomeprazole] Rash        Medication List        Accurate as of March 12, 2021 11:59 PM. If you have any questions, ask your nurse or doctor.          famotidine 40 MG tablet Commonly known as: PEPCID Take 1 tablet (40 mg total) by mouth 2 (two) times daily.   triamcinolone 0.025 % ointment Commonly known as: KENALOG Apply 1 application topically 2 (two) times daily.        Birth  History: non-contributory  Developmental History: non-contributory  Past Surgical History: Past Surgical History:  Procedure Laterality Date   MIDDLE EAR SURGERY       Family History: No family history on file.   Social History: Kaid lives at home with his family.  They live in a house that is 32 years old.  There is hardwood throughout the home.  They have carpeting in the bedrooms.  They have electric heating and central cooling.  There is a dog inside of the home.  There are no dust mite covers on the bedding.  There is tobacco exposure in the home.  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  He does not use a HEPA filter.  He does not live near an interstate or industrial area.  He has smoked 1 pack/day since 2006.   Review of Systems  Constitutional: Negative.  Negative for chills, fever, malaise/fatigue and weight loss.  HENT: Negative.  Negative for congestion, ear discharge and ear pain.   Eyes:  Negative for pain, discharge and redness.  Respiratory:  Negative for cough, sputum production, shortness of breath and wheezing.   Cardiovascular: Negative.  Negative for chest pain and palpitations.  Gastrointestinal:  Negative for abdominal pain, constipation, diarrhea, heartburn, nausea and vomiting.  Skin: Negative.  Negative for itching and rash.  Neurological:  Negative for dizziness and headaches.  Endo/Heme/Allergies:  Negative for environmental allergies. Does not bruise/bleed easily.      Objective:   Blood pressure 136/80, pulse 79, temperature 98.7 F (37.1 C), temperature source Temporal, resp. rate 16, height 6\' 1"  (1.854 m), weight 175 lb 12.8 oz (79.7 kg), SpO2 98 %. Body mass index is 23.19 kg/m.   Physical Exam:   Physical Exam Vitals reviewed.  Constitutional:      Appearance: He is well-developed.  HENT:     Head: Normocephalic and atraumatic.     Right Ear: Tympanic membrane, ear canal and external ear normal. No drainage, swelling or  tenderness. Tympanic membrane is not injected, scarred, erythematous, retracted or bulging.     Left Ear: Tympanic membrane, ear canal and external ear normal. No drainage, swelling or tenderness. Tympanic membrane is not injected, scarred, erythematous, retracted or bulging.     Nose: No nasal deformity, septal deviation, mucosal edema or rhinorrhea.     Right Turbinates: Enlarged, swollen and pale.     Left Turbinates: Enlarged, swollen and pale.     Right Sinus: No maxillary sinus tenderness or frontal sinus tenderness.     Left Sinus: No maxillary sinus tenderness or frontal sinus tenderness.     Mouth/Throat:     Mouth: Mucous membranes are not pale and not dry.  Pharynx: Uvula midline.  Eyes:     General:        Right eye: No discharge.        Left eye: No discharge.     Conjunctiva/sclera: Conjunctivae normal.     Right eye: Right conjunctiva is not injected. No chemosis.    Left eye: Left conjunctiva is not injected. No chemosis.    Pupils: Pupils are equal, round, and reactive to light.  Cardiovascular:     Rate and Rhythm: Normal rate and regular rhythm.     Heart sounds: Normal heart sounds.  Pulmonary:     Effort: Pulmonary effort is normal. No tachypnea, accessory muscle usage or respiratory distress.     Breath sounds: Normal breath sounds. No wheezing, rhonchi or rales.     Comments: Moving air well in all lung fields.  Chest:     Chest wall: No tenderness.  Abdominal:     Tenderness: There is no abdominal tenderness. There is no guarding or rebound.  Lymphadenopathy:     Head:     Right side of head: No submandibular, tonsillar or occipital adenopathy.     Left side of head: No submandibular, tonsillar or occipital adenopathy.     Cervical: No cervical adenopathy.  Skin:    General: Skin is warm.     Capillary Refill: Capillary refill takes less than 2 seconds.     Coloration: Skin is not pale.     Findings: No abrasion, erythema, petechiae or rash. Rash is  not papular, urticarial or vesicular.     Comments: He does have a few tiny residual lesions on his arms,  Neurological:     Mental Status: He is alert.  Psychiatric:        Behavior: Behavior is cooperative.     Diagnostic studies: labs sent instead        Malachi Bonds, MD [facility]

## 2021-03-12 NOTE — Patient Instructions (Addendum)
1. Chronic urticaria - We did not do testing since you did not have clear environmental or food allergy symptoms.  - I think that would be a waste of time and money.  - We are going to get an alpha gal panel to see if you have a red meat sensitivity. - We are going to get environmental allergy testing via the blood.  - I would start a daily antihistamine EVERY day to keep the hives at bay (you can do up to twice daily). - Continue with the famotidine.   2. Return in about 3 months (around 06/11/2021).    Please inform us of any Emergency Department visits, hospitalizations, or changes in symptoms. Call us before going to the ED for breathing or allergy symptoms since we might be able to fit you in for a sick visit. Feel free to contact us anytime with any questions, problems, or concerns.  It was a pleasure to meet you today!  Websites that have reliable patient information: 1. American Academy of Asthma, Allergy, and Immunology: www.aaaai.org 2. Food Allergy Research and Education (FARE): foodallergy.org 3. Mothers of Asthmatics: http://www.asthmacommunitynetwork.org 4. American College of Allergy, Asthma, and Immunology: www.acaai.org   COVID-19 Vaccine Information can be found at: [email or web address] For questions related to vaccine distribution or appointments, please email [email or web address] or call [phone].   We realize that you might be concerned about having an allergic reaction to the COVID19 vaccines. To help with that concern, WE ARE OFFERING THE COVID19 VACCINES IN OUR OFFICE! Ask the front desk for dates!     "Like" [place] on Facebook and Instagram for our latest updates!      A healthy democracy works best when [employer] participate! Make sure you are registered to vote! If you have moved or changed any of your contact information, you will need to get this updated before voting!  In some cases, you MAY be  able to register to vote online: [email or web address]

## 2021-03-13 ENCOUNTER — Encounter: Payer: Self-pay | Admitting: Allergy & Immunology

## 2021-03-16 LAB — ALPHA-GAL PANEL
Allergen Lamb IgE: <0.1 kU/L
Beef IgE: <0.1 kU/L
IgE (Immunoglobulin E), Serum: 134 IU/mL (ref 6–495)
O215-IgE Alpha-Gal: <0.1 kU/L
Pork IgE: <0.1 kU/L

## 2021-06-13 ENCOUNTER — Ambulatory Visit: Payer: Self-pay | Admitting: Allergy

## 2021-08-20 ENCOUNTER — Ambulatory Visit (INDEPENDENT_AMBULATORY_CARE_PROVIDER_SITE_OTHER): Payer: BC Managed Care – PPO | Admitting: Allergy & Immunology

## 2021-08-20 ENCOUNTER — Encounter: Payer: Self-pay | Admitting: Allergy & Immunology

## 2021-08-20 ENCOUNTER — Other Ambulatory Visit: Payer: Self-pay

## 2021-08-20 VITALS — BP 140/80 | HR 84 | Temp 97.8°F | Resp 18 | Ht 72.0 in | Wt 177.6 lb

## 2021-08-20 DIAGNOSIS — T50905D Adverse effect of unspecified drugs, medicaments and biological substances, subsequent encounter: Secondary | ICD-10-CM

## 2021-08-20 DIAGNOSIS — L508 Other urticaria: Secondary | ICD-10-CM | POA: Diagnosis not present

## 2021-08-20 MED ORDER — FAMOTIDINE 40 MG PO TABS: 40.0000 mg | ORAL_TABLET | Freq: Two times a day (BID) | ORAL | 1 refills | Status: DC

## 2021-08-20 NOTE — Progress Notes (Signed)
FOLLOW UP  Date of Service/Encounter:  08/20/21   Assessment:   Chronic urticaria in the setting of no atopic history   Concern for ibuprofen allergy  Plan/Recommendations:   1. Chronic urticaria - We are going to get some labs to rule out serious causes of difficult to control hives. - Hopefully these will all be negative. - We will call you in 1 to 2 weeks with the results of the testing. - In the meantime, continue with famotidine and Zyrtec 1-2 times daily.  2. Adverse effect of drug - You tolerated your ibuprofen challenge today. - I do not think that this is related to your hives at all. - You can go ahead and put this back in to your medication repertoire.  - Call us with any problems.  3. Return in about 6 months (around 02/17/2022).    Subjective:   Jesus Jacobson is a 31 y.o. male presenting today for follow up of  Chief Complaint  Patient presents with   Urticaria    3 mth f/u - A lot better. Patient states he still has lab work to finish getting as well.    Jesus Jacobson has a history of the following: Patient Active Problem List   Diagnosis Date Noted   Encounter to establish care 12/27/2020   Gastroesophageal reflux disease without esophagitis 12/27/2020   Tobacco abuse 12/27/2020   Allergic reaction 12/27/2020    History obtained from: chart review and patient.  Jesus Jacobson is a 31 y.o. male presenting for a follow up visit.  He was last seen in September 2022.  At that time, we did not do testing since he had no atopic history. We obtained an alpha gal panel and environmental allergy testing via the blood. I recommended starting a strong antihistamine to suppress the hives BID and continued with famotidine.   Since the last visit, he has been on famotidine and cetirizine once daily. He wakes up at 4am and he takes it around 4:30 in the morning. He has not had a breakout like he did last year. They still pop up in some places, but nothing like  what he was experiencing when I saw him.   He has avoid NSAIDs since that time. He is wondering whether this was the trigger after our conversation last time. He does not need ibuprofen often but he would like ot take it for a muscle ache or something instead. He has bene using Tylenol but it does not work as well as ibuprofen. He is open to doing a challenge today. He is nervous about doing this at home.   Otherwise, there have been no changes to his past medical history, surgical history, family history, or social history.    Review of Systems  Constitutional: Negative.  Negative for chills, fever, malaise/fatigue and weight loss.  HENT: Negative.  Negative for congestion, ear discharge, ear pain and sore throat.   Eyes:  Negative for pain, discharge and redness.  Respiratory:  Negative for cough, sputum production, shortness of breath and wheezing.   Cardiovascular: Negative.  Negative for chest pain and palpitations.  Gastrointestinal:  Negative for abdominal pain, constipation, diarrhea, heartburn, nausea and vomiting.  Skin:  Positive for itching and rash.  Neurological:  Negative for dizziness and headaches.  Endo/Heme/Allergies:  Negative for environmental allergies. Does not bruise/bleed easily.      Objective:   Blood pressure 140/80, pulse 84, temperature 97.8 F (36.6 C), resp. rate 18, height 6' (1.829 m),  weight 177 lb 9.6 oz (80.6 kg), SpO2 97 %. Body mass index is 24.09 kg/m.    Physical Exam Constitutional:      Appearance: He is well-developed.  HENT:     Head: Normocephalic and atraumatic.     Right Ear: Tympanic membrane, ear canal and external ear normal.     Left Ear: Tympanic membrane, ear canal and external ear normal.     Nose: No nasal deformity, septal deviation, mucosal edema or rhinorrhea.     Right Turbinates: Not enlarged or swollen.     Left Turbinates: Not enlarged or swollen.     Right Sinus: No maxillary sinus tenderness or frontal sinus  tenderness.     Left Sinus: No maxillary sinus tenderness or frontal sinus tenderness.     Mouth/Throat:     Mouth: Mucous membranes are not pale and not dry.     Pharynx: Uvula midline.  Eyes:     General: Lids are normal. No allergic shiner.       Right eye: No discharge.        Left eye: No discharge.     Conjunctiva/sclera: Conjunctivae normal.     Right eye: Right conjunctiva is not injected. No chemosis.    Left eye: Left conjunctiva is not injected. No chemosis.    Pupils: Pupils are equal, round, and reactive to light.  Cardiovascular:     Rate and Rhythm: Normal rate and regular rhythm.     Heart sounds: Normal heart sounds.  Pulmonary:     Effort: Pulmonary effort is normal. No tachypnea, accessory muscle usage or respiratory distress.     Breath sounds: Normal breath sounds. No wheezing, rhonchi or rales.  Chest:     Chest wall: No tenderness.  Lymphadenopathy:     Cervical: No cervical adenopathy.  Skin:    Coloration: Skin is not pale.     Findings: No abrasion, erythema, petechiae or rash. Rash is not papular, urticarial or vesicular.  Neurological:     Mental Status: He is alert.  Psychiatric:        Behavior: Behavior is cooperative.     Diagnostic studies:    Patient was given ibuprofen 600mg  and monitored for 30 minutes. Vitals were monitored. He tolerated this without a problem.            , MD  [facility]

## 2021-08-20 NOTE — Patient Instructions (Addendum)
1. Chronic urticaria - We are going to get some labs to rule out serious causes of difficult to control hives. - Hopefully these will all be negative. - We will call you in 1 to 2 weeks with the results of the testing. - In the meantime, continue with famotidine and Zyrtec 1-2 times daily.  2. Adverse effect of drug - You tolerated your ibuprofen challenge today. - I do not think that this is related to your hives at all. - You can go ahead and put this back in to your medication repertoire.  - Call us with any problems.  3. Return in about 6 months (around 02/17/2022).    Please inform us of any Emergency Department visits, hospitalizations, or changes in symptoms. Call us before going to the ED for breathing or allergy symptoms since we might be able to fit you in for a sick visit. Feel free to contact us anytime with any questions, problems, or concerns.  It was a pleasure to see you again today!  Websites that have reliable patient information: 1. American Academy of Asthma, Allergy, and Immunology: www.aaaai.org 2. Food Allergy Research and Education (FARE): foodallergy.org 3. Mothers of Asthmatics: http://www.asthmacommunitynetwork.org 4. American College of Allergy, Asthma, and Immunology: www.acaai.org   COVID-19 Vaccine Information can be found at: [email or web address] For questions related to vaccine distribution or appointments, please email [email or web address] or call [phone].   We realize that you might be concerned about having an allergic reaction to the COVID19 vaccines. To help with that concern, WE ARE OFFERING THE COVID19 VACCINES IN OUR OFFICE! Ask the front desk for dates!     Like [place] on [employer] and Instagram for our latest updates!      A healthy democracy works best when [employer] participate! Make sure you are registered to vote! If you have moved or changed any of your contact information,  you will need to get this updated before voting!  In some cases, you MAY be able to register to vote online: [email or web address]

## 2022-01-02 ENCOUNTER — Encounter: Payer: Self-pay | Admitting: Internal Medicine

## 2022-01-10 ENCOUNTER — Ambulatory Visit (INDEPENDENT_AMBULATORY_CARE_PROVIDER_SITE_OTHER): Payer: BC Managed Care – PPO

## 2022-01-10 ENCOUNTER — Ambulatory Visit
Admission: EM | Admit: 2022-01-10 | Discharge: 2022-01-10 | Disposition: A | Discharge status: Home / Self Care | Payer: BC Managed Care – PPO

## 2022-01-10 DIAGNOSIS — M79671 Pain in right foot: Secondary | ICD-10-CM

## 2022-01-10 DIAGNOSIS — S92251A Displaced fracture of navicular [scaphoid] of right foot, initial encounter for closed fracture: Secondary | ICD-10-CM | POA: Diagnosis not present

## 2022-01-10 DIAGNOSIS — M25571 Pain in right ankle and joints of right foot: Secondary | ICD-10-CM | POA: Diagnosis not present

## 2022-01-10 DIAGNOSIS — S92201A Fracture of unspecified tarsal bone(s) of right foot, initial encounter for closed fracture: Secondary | ICD-10-CM | POA: Diagnosis not present

## 2022-01-10 DIAGNOSIS — S92001A Unspecified fracture of right calcaneus, initial encounter for closed fracture: Secondary | ICD-10-CM

## 2022-01-10 MED ORDER — TRAMADOL HCL 50 MG PO TABS: 50.0000 mg | ORAL_TABLET | Freq: Two times a day (BID) | ORAL | 0 refills | Status: AC | PRN

## 2022-01-10 NOTE — ED Provider Notes (Signed)
[facility]    CSN: [id number] Arrival date & time: 01/10/22  0825      History   Chief Complaint Chief Complaint  Patient presents with   Ankle Pain   Foot Pain    HPI Jesus Jacobson is a 31 y.o. male.   The history is provided by the patient.   Patient presents for right foot and ankle pain after he twisted his ankle in the yard last evening.  Patient complains of swelling, pain, pain with ambulation, and radiation of pain with movement of the foot.  He states that he used both ice and heat to help with the swelling last evening.  He also states that he took Tylenol.  Denies any previous injury to the right foot or ankle.  History reviewed. No pertinent past medical history.  Patient Active Problem List   Diagnosis Date Noted   Encounter to establish care 12/27/2020   Gastroesophageal reflux disease without esophagitis 12/27/2020   Tobacco abuse 12/27/2020   Allergic reaction 12/27/2020    Past Surgical History:  Procedure Laterality Date   MIDDLE EAR SURGERY         Home Medications    Prior to Admission medications   Medication Sig Start Date End Date Taking? Authorizing Provider  acetaminophen (TYLENOL) 500 MG tablet Take 500 mg by mouth every 6 (six) hours as needed.   Yes [provider]  cetirizine (ZYRTEC) 10 MG tablet Take 10 mg by mouth daily.   Yes [provider]  traMADol (ULTRAM) 50 MG tablet Take 1 tablet (50 mg total) by mouth every 12 (twelve) hours as needed. 01/10/22  Yes Debby Clyne-Warren, Sadie Haber, NP  famotidine (PEPCID) 40 MG tablet Take 1 tablet (40 mg total) by mouth 2 (two) times daily. 08/20/21   Alfonse Spruce, MD  triamcinolone (KENALOG) 0.025 % ointment Apply 1 application topically 2 (two) times daily. 12/22/20   Bing Neighbors, FNP    Family History History reviewed. No pertinent family history.  Social History Social History   Tobacco Use   Smoking status: Every Day    Packs/day: 1.00     Types: Cigarettes   Smokeless tobacco: Never  Vaping Use   Vaping Use: Never used  Substance Use Topics   Alcohol use: Yes    Alcohol/week: 42.0 standard drinks of alcohol    Types: 42 Cans of beer per week    Comment: 1-2 daily   Drug use: No     Allergies   Esomeprazole magnesium and Nexium [esomeprazole]   Review of Systems Review of Systems Per HPI  Physical Exam Triage Vital Signs ED Triage Vitals  Enc Vitals Group     BP 01/10/22 0836 127/81     Pulse Rate 01/10/22 0836 81     Resp 01/10/22 0836 16     Temp 01/10/22 0836 98.5 F (36.9 C)     Temp Source 01/10/22 0836 Oral     SpO2 01/10/22 0836 97 %     Weight --      Height --      Head Circumference --      Peak Flow --      Pain Score 01/10/22 0834 7     Pain Loc --      Pain Edu? --      Excl. in GC? --    No data found.  Updated Vital Signs BP 127/81 (BP Location: Right Arm)   Pulse 81   Temp 98.5  F (36.9 C) (Oral)   Resp 16   SpO2 97%   Visual Acuity Right Eye Distance:   Left Eye Distance:   Bilateral Distance:    Right Eye Near:   Left Eye Near:    Bilateral Near:     Physical Exam Vitals and nursing note reviewed.  Constitutional:      General: He is not in acute distress.    Appearance: Normal appearance.  HENT:     Head: Normocephalic.  Pulmonary:     Effort: Pulmonary effort is normal.  Musculoskeletal:     Cervical back: Normal range of motion.     Right ankle: Swelling present. No ecchymosis. Tenderness present over the lateral malleolus. Decreased range of motion.     Right foot: Decreased range of motion. Normal capillary refill. Swelling and tenderness present. Normal pulse.     Comments: Tenderness noted to the cuboid, calcaneus and lateral malleolus of the right foot.  Swelling is present.  No obvious deformity noted.  Decreased range of motion with dorsi and plantarflexion, and with inversion and eversion.  Skin:    General: Skin is warm and dry.  Neurological:      General: No focal deficit present.     Mental Status: He is alert and oriented to person, place, and time.  Psychiatric:        Mood and Affect: Mood normal.        Behavior: Behavior normal.      UC Treatments / Results  Labs (all labs ordered are listed, but only abnormal results are displayed) Labs Reviewed - No data to display  EKG   Radiology DG Foot Complete Right  Result Date: 01/10/2022 CLINICAL DATA:  Pain and swelling. EXAM: RIGHT FOOT COMPLETE - 3+ VIEW COMPARISON:  None Available. FINDINGS: The soft tissues are unremarkable. There is lateral soft tissue swelling noted about the ankle. There is a thin, curvilinear os ossific density adjacent to the lateral tarsal bones suspicious for avulsion injury. Additionally, there is dorsal soft tissue swelling overlying the tarsal bones with signs of avulsion injury involving the dorsal aspect of the navicular bone. IMPRESSION: 1. Suspect avulsion fractures involving the dorsal aspect of the navicular bone and lateral tarsal bones. 2. Lateral and dorsal soft tissue swelling. Electronically Signed   By: Signa Kell M.D.   On: 01/10/2022 09:24   DG Ankle Complete Right  Result Date: 01/10/2022 CLINICAL DATA:  Swelling, pain EXAM: RIGHT ANKLE - COMPLETE 3+ VIEW COMPARISON:  Right ankle radiograph 01/03/2004 FINDINGS: There is ankle soft tissue swelling. There is a mildly displaced extensor digitorum brevis avulsion fracture of the anterolateral calcaneus. Additionally, there is a minimally displaced avulsion fracture of the dorsal navicular IMPRESSION: Mildly displaced extensor digitorum brevis avulsion fracture of the anterolateral calcaneus. Minimally displaced avulsion fracture of the dorsal navicular. Electronically Signed   By: Caprice Renshaw M.D.   On: 01/10/2022 09:22    Procedures Procedures (including critical care time)  Medications Ordered in UC Medications - No data to display  Initial Impression / Assessment and Plan /  UC Course  I have reviewed the triage vital signs and the nursing notes.  Pertinent labs & imaging results that were available during my care of the patient were reviewed by me and considered in my medical decision making (see chart for details).  Patient presents for pain and swelling in the right foot and ankle after he rolled the right foot and ankle last evening.  On exam, he has  decreased range of motion with dorsiflexion and plantarflexion and with inversion and eversion.  There is moderate swelling noted to the lateral malleolus.  X-rays show*fractures in the navicular bone, tarsal bone, and calcaneus of the right foot.  A cam boot was provided for support and stability.  Supportive care recommendations were provided to the patient.  Patient was advised that he will need to follow-up with orthopedics within the next 48 to 72 hours.  Information was given for both [facility] and [facility].  Follow-up as needed. Final Clinical Impressions(s) / UC Diagnoses   Final diagnoses:  Closed avulsion fracture of navicular bone of right foot, initial encounter  Closed displaced fracture of tarsal bone of right foot, unspecified tarsal bone, initial encounter  Closed displaced fracture of right calcaneus, unspecified portion of calcaneus, initial encounter     Discharge Instructions      Your x-rays show you have fractured bones in your ankle and in your foot.    Take medication as prescribed. May take over-the-counter ibuprofen or Tylenol for pain or discomfort. RICE therapy rest, ice, compression, and elevation until your symptoms improve.  Apply ice for 20 minutes, remove for 1 hour, then repeat as often as possible.  This will help with pain and swelling. Wear the cam boot with ambulation.  Recommend staying off the foot and ankle is much as possible. You will need to follow-up with orthopedics within the next 48 to 72 hours for further evaluation.  You can follow-up with [facility] [place] [place] at [phone] or [facility] in [place] at [phone].      ED Prescriptions     Medication Sig Dispense Auth. Provider   traMADol (ULTRAM) 50 MG tablet Take 1 tablet (50 mg total) by mouth every 12 (twelve) hours as needed. 10 tablet Batina Dougan-Warren, Sadie Haber, NP      I have reviewed the PDMP during this encounter.   Abran Cantor, NP 01/10/22 [phone]

## 2022-01-10 NOTE — ED Triage Notes (Signed)
Pt reports pain and swelling in right ankle and top of right foot, after he twisted the right ankle  x 1 day. Tylenol gives some relief.

## 2022-01-10 NOTE — Discharge Instructions (Addendum)
Your x-rays show you have fractured bones in your ankle and in your foot.    Take medication as prescribed. May take over-the-counter ibuprofen or Tylenol for pain or discomfort. RICE therapy rest, ice, compression, and elevation until your symptoms improve.  Apply ice for 20 minutes, remove for 1 hour, then repeat as often as possible.  This will help with pain and swelling. Wear the cam boot with ambulation.  Recommend staying off the foot and ankle is much as possible. You will need to follow-up with orthopedics within the next 48 to 72 hours for further evaluation.  You can follow-up with [facility] at [phone] or [facility] in [place] at [phone].

## 2022-01-15 ENCOUNTER — Ambulatory Visit: Payer: BC Managed Care – PPO | Admitting: Orthopaedic Surgery

## 2022-02-20 ENCOUNTER — Ambulatory Visit: Payer: BC Managed Care – PPO | Admitting: Allergy & Immunology

## 2022-02-23 ENCOUNTER — Encounter: Payer: Self-pay | Admitting: Internal Medicine

## 2022-08-04 ENCOUNTER — Other Ambulatory Visit: Payer: Self-pay | Admitting: Allergy & Immunology

## 2022-09-30 ENCOUNTER — Encounter: Payer: Self-pay | Admitting: Internal Medicine

## 2022-09-30 ENCOUNTER — Other Ambulatory Visit: Payer: Self-pay

## 2022-09-30 ENCOUNTER — Ambulatory Visit (INDEPENDENT_AMBULATORY_CARE_PROVIDER_SITE_OTHER): Payer: BC Managed Care – PPO | Admitting: Internal Medicine

## 2022-09-30 VITALS — BP 130/80 | HR 98 | Temp 98.4°F | Resp 17 | Ht 71.26 in | Wt 185.2 lb

## 2022-09-30 DIAGNOSIS — Z889 Allergy status to unspecified drugs, medicaments and biological substances status: Secondary | ICD-10-CM

## 2022-09-30 DIAGNOSIS — T50905D Adverse effect of unspecified drugs, medicaments and biological substances, subsequent encounter: Secondary | ICD-10-CM

## 2022-09-30 DIAGNOSIS — L501 Idiopathic urticaria: Secondary | ICD-10-CM | POA: Diagnosis not present

## 2022-09-30 DIAGNOSIS — K219 Gastro-esophageal reflux disease without esophagitis: Secondary | ICD-10-CM

## 2022-09-30 MED ORDER — CETIRIZINE HCL 10 MG PO TABS: 10.0000 mg | ORAL_TABLET | Freq: Every day | ORAL | 11 refills | Status: AC

## 2022-09-30 MED ORDER — FAMOTIDINE 40 MG PO TABS: 40.0000 mg | ORAL_TABLET | Freq: Two times a day (BID) | ORAL | 11 refills | Status: AC | PRN

## 2022-09-30 NOTE — Patient Instructions (Addendum)
Idiopathic Urticaria (Hives): - At this time etiology of hives and swelling is unknown. Hives can be caused by a variety of different triggers including illness/infection, exercise, pressure, vibrations, extremes of temperature to name a few however majority of the time there is no identifiable trigger.  -Continue Zyrtec 10mg  daily.   -If hives reoccur, increase to Zyrtec 10mg  twice daily.   -If hives still reoccur, add Pepcid 20mg  twice daily and continue Zyrtec 10mg  twice daily.  GERD - Use Pepcid 40mg  twice daily as needed for reflux.  -Avoid lying down for at least two hours after a meal or after drinking acidic beverages, like soda, or other caffeinated beverages. This can help to prevent stomach contents from flowing back into the esophagus. -Keep your head elevated while you sleep. Using an extra pillow or two can also help to prevent reflux. -Eat smaller and more frequent meals each day instead of a few large meals. This promotes digestion and can aid in preventing heartburn. -Wear loose-fitting clothes to ease pressure on the stomach, which can worsen heartburn and reflux. -Reduce excess weight around the midsection. This can ease pressure on the stomach. Such pressure can force some stomach contents back up the esophagus.   Adverse effect of drug - You tolerated your ibuprofen challenge in the past.  - I do not think that this is related to your hives at all. - You can go ahead and put this back in to your medication repertoire.  - Call us with any problems.

## 2022-09-30 NOTE — Progress Notes (Signed)
FOLLOW UP Date of Service/Encounter:  09/30/22   Subjective:  Jesus Jacobson (DOB: 09-01-1990) is a 32 y.o. male who returns to the [facility] on 09/30/2022 for follow up for idiopathic urticaria.   History obtained from: chart review and patient. Last visit was a drug challenge to ibuprofen with Dr. Ernst Bowler on 08/20/2021 and he did fine with it. Also was seen for CIU on Zyrtec/Pepcid.   Urticaria: Few episodes of hives since last visit.  They usually happen on his arms when working.  Don't last too long.  No scarring/pain.  Taking Zyrtec 10mg  daily.  There was concern for ibuprofen allergy but he passed a drug challenge to this 08/2021.  Has not required NSAIDs since then and still a little afraid to take it.   Reflux: Also taking Pepcid for reflux.  Previously seen in ER for chest pain and reports having a negative cardiac workup and was thought to be GERD. Doing well, not much heartburn or sour taste.   Past Medical History: History reviewed. No pertinent past medical history.  Objective:  BP 130/80 (BP Location: Left Arm, Patient Position: Sitting, Cuff Size: Normal)   Pulse 98   Temp 98.4 F (36.9 C) (Temporal)   Resp 17   Ht 5' 11.26" (1.81 m)   Wt 185 lb 3.2 oz (84 kg)   SpO2 96%   BMI 25.64 kg/m  Body mass index is 25.64 kg/m. Physical Exam: GEN: alert, well developed HEENT: clear conjunctiva, nose with mild inferior turbinate hypertrophy, pink nasal mucosa, no rhinorrhea, no cobblestoning HEART: regular rate and rhythm, no murmur LUNGS: clear to auscultation bilaterally, no coughing, unlabored respiration SKIN: no rashes or lesions  Assessment:   1. Chronic idiopathic urticaria   2. Gastroesophageal reflux disease without esophagitis   3. Adverse effect of drug, subsequent encounter     Plan/Recommendations:  Idiopathic Urticaria (Hives): - Well controlled.  At this time etiology of hives and swelling is unknown. Hives can be caused by a  variety of different triggers including illness/infection, exercise, pressure, vibrations, extremes of temperature to name a few however majority of the time there is no identifiable trigger.  - If frequent flare ups, can consider urticaria lab workup in future.  -Continue Zyrtec 10mg  daily.   -If hives reoccur, increase to Zyrtec 10mg  twice daily.   -If hives still reoccur, add Pepcid 20mg  twice daily and continue Zyrtec 10mg  twice daily.  GERD - Well controlled. Use Pepcid 40mg  twice daily as needed for reflux.  -Avoid lying down for at least two hours after a meal or after drinking acidic beverages, like soda, or other caffeinated beverages. This can help to prevent stomach contents from flowing back into the esophagus. -Keep your head elevated while you sleep. Using an extra pillow or two can also help to prevent reflux. -Eat smaller and more frequent meals each day instead of a few large meals. This promotes digestion and can aid in preventing heartburn. -Wear loose-fitting clothes to ease pressure on the stomach, which can worsen heartburn and reflux. -Reduce excess weight around the midsection. This can ease pressure on the stomach. Such pressure can force some stomach contents back up the esophagus.   Adverse effect of drug - You tolerated your ibuprofen challenge in the past.  - I do not think that this is related to your hives at all. - You can go ahead and put this back in to your medication repertoire.  - Call us with any problems.  Return in about 1 year (around 09/30/2023).  Harlon Flor, MD [facility] [place] [facility]

## 2023-01-09 IMAGING — DX DG CHEST 1V PORT
1 series · 1 of 1 positions shown · non-contrast
Comparison: None.

CLINICAL DATA: Chest pain.

EXAM:
PORTABLE CHEST 1 VIEW

[chest ap]
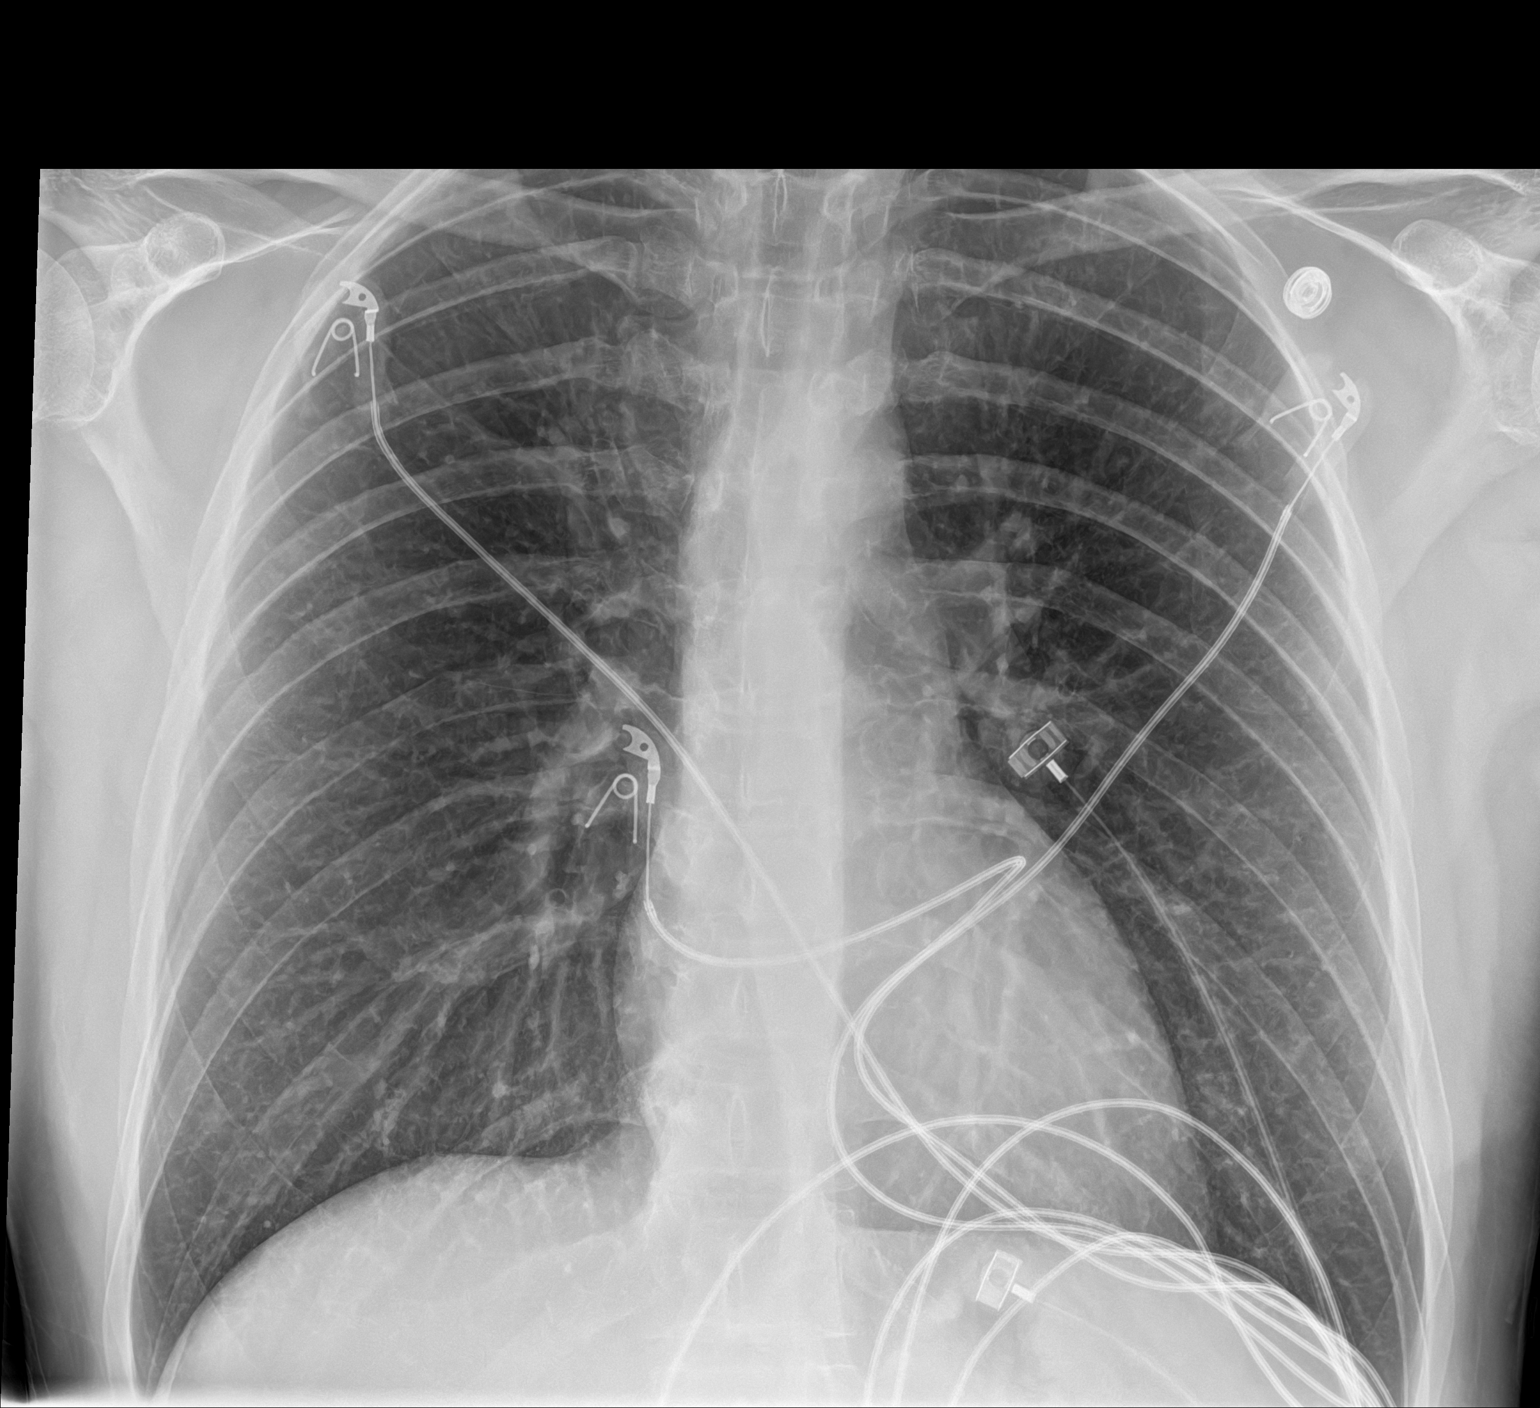

[1 of 1 positions shown; findings below may reference images not displayed]

FINDINGS: The heart size and mediastinal contours are within normal limits.
Both lungs are clear. The visualized skeletal structures are
unremarkable.
IMPRESSION: No active disease.
